# Patient Record
Sex: Female | Born: 1995 | Race: White | Hispanic: No | Marital: Single
Health system: Southern US, Community
[De-identification: ages and names within clinical notes are randomized; demographics above are authoritative.]

---

## 2017-09-06 ENCOUNTER — Emergency Department (HOSPITAL_COMMUNITY)
Admission: EM | Admit: 2017-09-06 | Discharge: 2017-09-07 | Disposition: A | Discharge status: Home / Self Care | Payer: BLUE CROSS/BLUE SHIELD | Attending: Physician Assistant | Admitting: Physician Assistant

## 2017-09-06 ENCOUNTER — Other Ambulatory Visit: Payer: Self-pay

## 2017-09-06 ENCOUNTER — Emergency Department (HOSPITAL_COMMUNITY): Payer: BLUE CROSS/BLUE SHIELD

## 2017-09-06 DIAGNOSIS — Y9364 Activity, baseball: Secondary | ICD-10-CM | POA: Diagnosis not present

## 2017-09-06 DIAGNOSIS — W228XXA Striking against or struck by other objects, initial encounter: Secondary | ICD-10-CM | POA: Insufficient documentation

## 2017-09-06 DIAGNOSIS — Y999 Unspecified external cause status: Secondary | ICD-10-CM | POA: Insufficient documentation

## 2017-09-06 DIAGNOSIS — Y929 Unspecified place or not applicable: Secondary | ICD-10-CM | POA: Insufficient documentation

## 2017-09-06 DIAGNOSIS — S139XXA Sprain of joints and ligaments of unspecified parts of neck, initial encounter: Secondary | ICD-10-CM

## 2017-09-06 DIAGNOSIS — S134XXA Sprain of ligaments of cervical spine, initial encounter: Secondary | ICD-10-CM | POA: Diagnosis present

## 2017-09-06 MED ORDER — OXYCODONE-ACETAMINOPHEN 5-325 MG PO TABS
1.0000 | ORAL_TABLET | Freq: Once | ORAL | Status: AC
  Administered 2017-09-07: 1 via ORAL
  Filled 2017-09-06: qty 1

## 2017-09-06 MED ORDER — LIDOCAINE 5 % EX PTCH
1.0000 | MEDICATED_PATCH | CUTANEOUS | Status: DC
  Administered 2017-09-07: 1 via TRANSDERMAL
  Filled 2017-09-06: qty 1

## 2017-09-06 MED ORDER — NAPROXEN 250 MG PO TABS
500.0000 mg | ORAL_TABLET | Freq: Once | ORAL | Status: AC
  Administered 2017-09-07: 500 mg via ORAL
  Filled 2017-09-06: qty 2

## 2017-09-06 MED ORDER — ONDANSETRON 4 MG PO TBDP
8.0000 mg | ORAL_TABLET | Freq: Once | ORAL | Status: AC
Start: 2017-09-07 — End: 2017-09-07
  Administered 2017-09-07: 8 mg via ORAL
  Filled 2017-09-06: qty 2

## 2017-09-06 NOTE — ED Triage Notes (Addendum)
Pt states that she dove for a softball yesterday and has had worsening neck pain and stiffness since. She reports tingling everywhere. Denies any episodes of incontinence. Pt ambulatory to triage.

## 2017-09-07 MED ORDER — LIDOCAINE 5 % EX PTCH: 1.0000 | MEDICATED_PATCH | CUTANEOUS | 0 refills | Status: AC

## 2017-09-07 MED ORDER — METHOCARBAMOL 500 MG PO TABS: 500.0000 mg | ORAL_TABLET | Freq: Three times a day (TID) | ORAL | 0 refills | Status: AC | PRN

## 2017-09-07 MED ORDER — NAPROXEN 500 MG PO TABS: 500.0000 mg | ORAL_TABLET | Freq: Two times a day (BID) | ORAL | 0 refills | Status: AC

## 2017-09-07 NOTE — Discharge Instructions (Signed)
You should restrict all sports acitivity and be excluded from play until you are asymptomatic with normal range of motion and strength. This can better be evaluated by ongoing follow ups with your primary care doctor or an Orthopedist.  We recommend daily use of naproxen for management of inflammation.  You may take Robaxin as prescribed for muscle spasms.  Supplement this with a Lidoderm patch as needed for persistent pain.  We advise that you alternate ice and heat to your neck 3-4 times per day for 15-20 minutes each time.  Should you experience inability to lift your arms or legs, complete loss of sensation in your extremities, incontinence of stool or urine, or any other concerning symptoms, promptly return to the emergency department for additional evaluation.
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
## 2018-08-06 IMAGING — CT CT CERVICAL SPINE W/O CM
3 of 4 series · 13 of 33 positions shown, 16 images · non-contrast
Comparison: None.

CLINICAL DATA: Worsening neck pain and stiffness after diving for
softball yesterday.

EXAM:
CT CERVICAL SPINE WITHOUT CONTRAST
TECHNIQUE: Multidetector CT imaging of the cervical spine was performed without
intravenous contrast. Multiplanar CT image reconstructions were also
generated.

[Series 4: c_spine 2.0 st · axial · 0.40mm/px · z∈[-282,-154]mm · 5 of 97 slices shown, 7 images]
[im 17/97  soft-tissue]
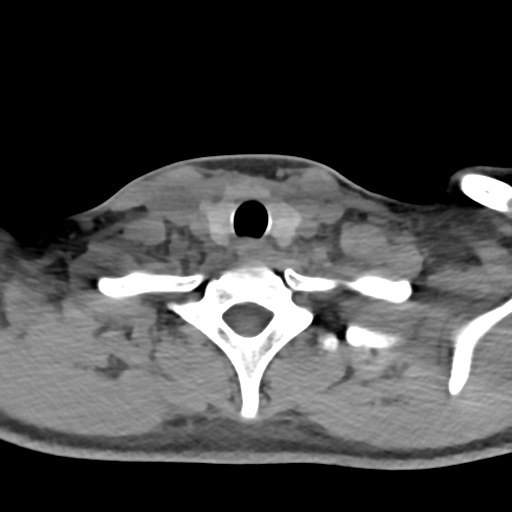
[im 17/97  bone]
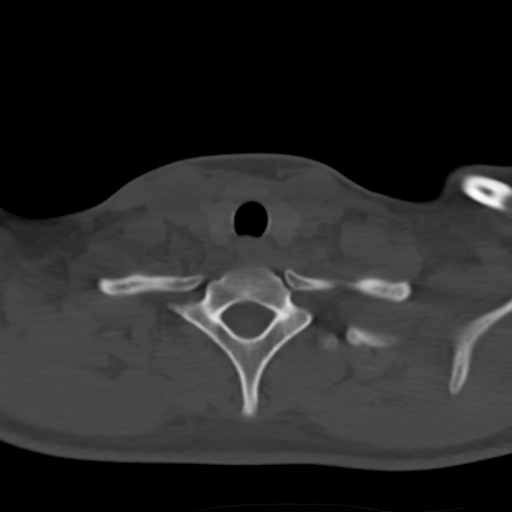
[im 33/97  bone]
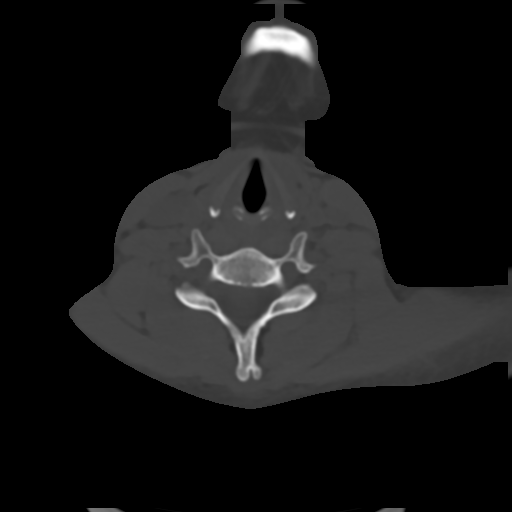
[im 49/97  bone]
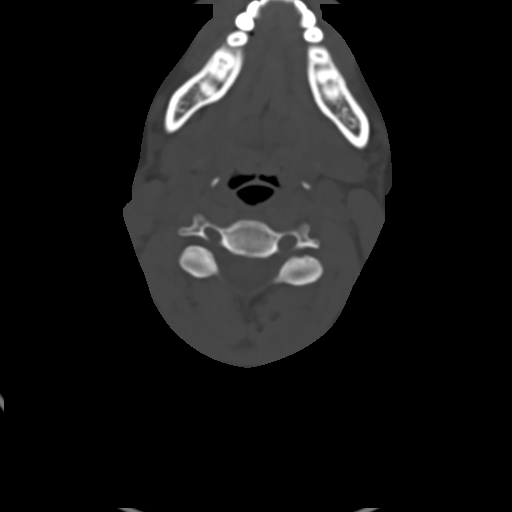
[im 65/97  bone]
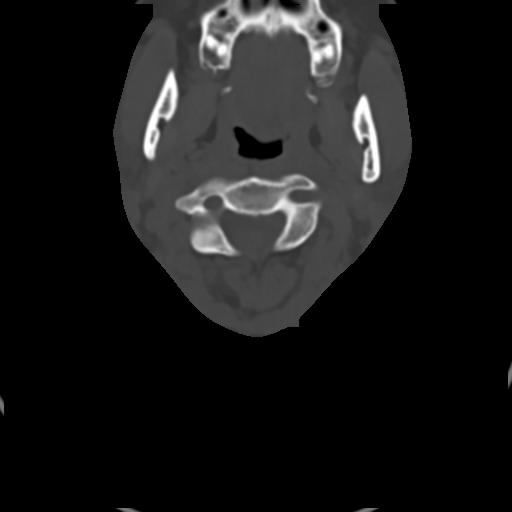
[im 81/97  soft-tissue]
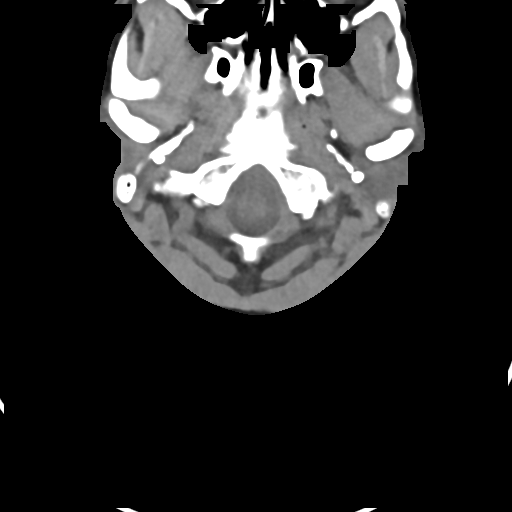
[im 81/97  bone]
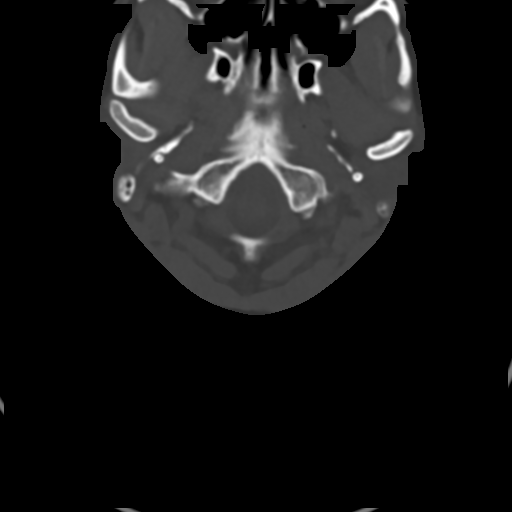

[Series 6: c_spine 2.0 sag bone · sagittal · 0.28mm/px · 5 of 61 slices shown, 6 images]
[im 21/61  bone]
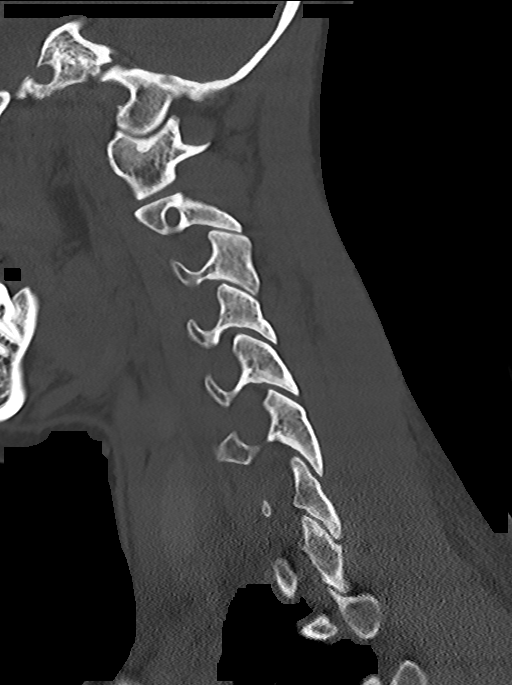
[im 26/61  bone]
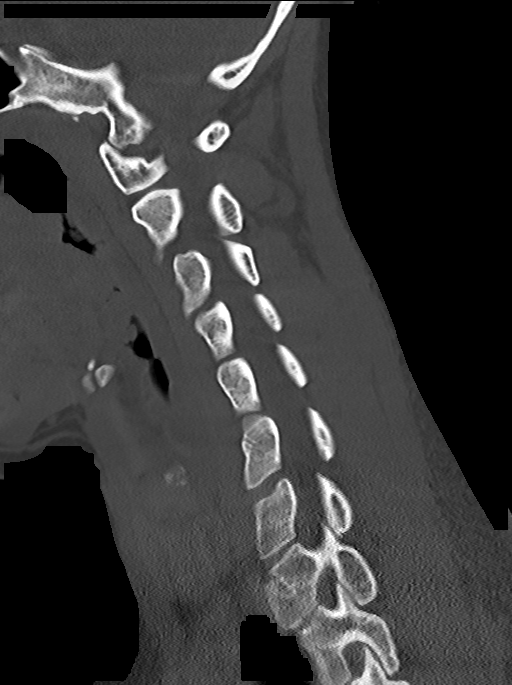
[im 31/61  soft-tissue]
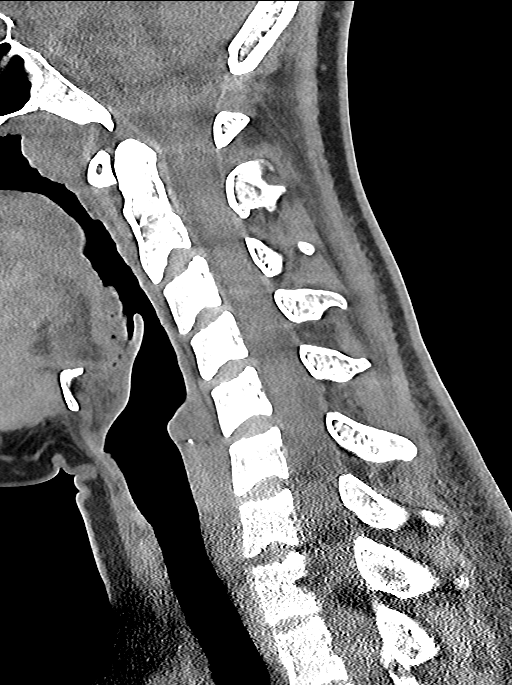
[im 31/61  bone]
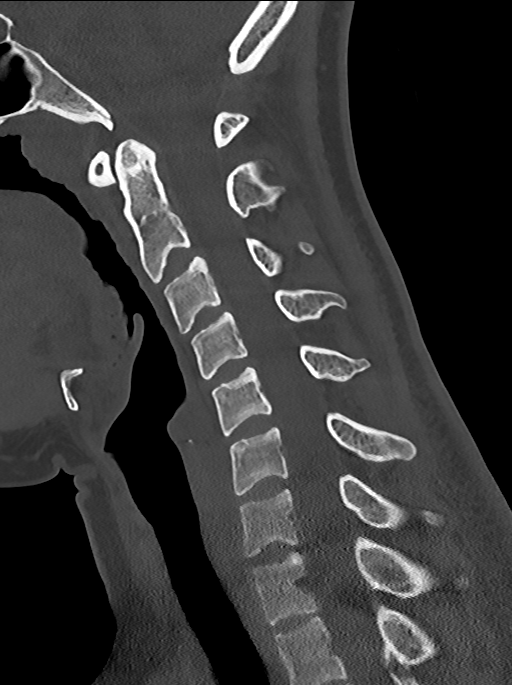
[im 36/61  bone]
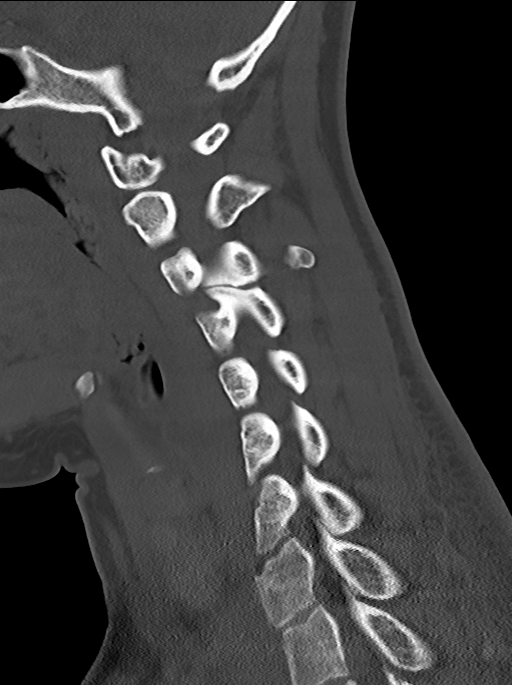
[im 41/61  bone]
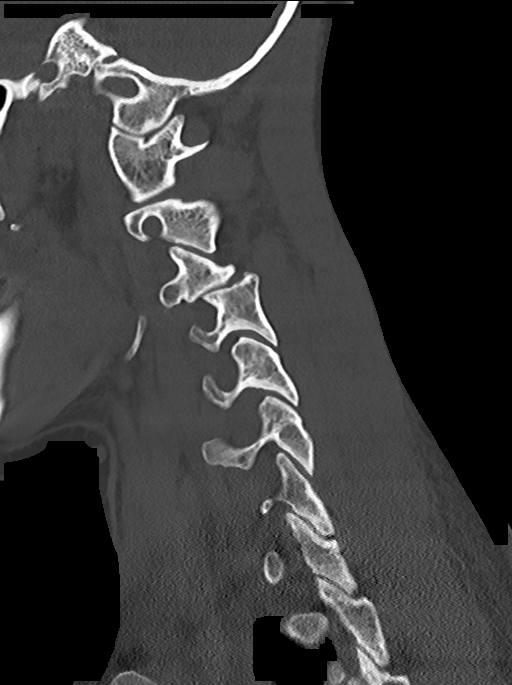

[Series 7: c_spine 2.0 cor bone · coronal · 0.28mm/px · 3 of 61 slices shown]
[im 13/61  bone]
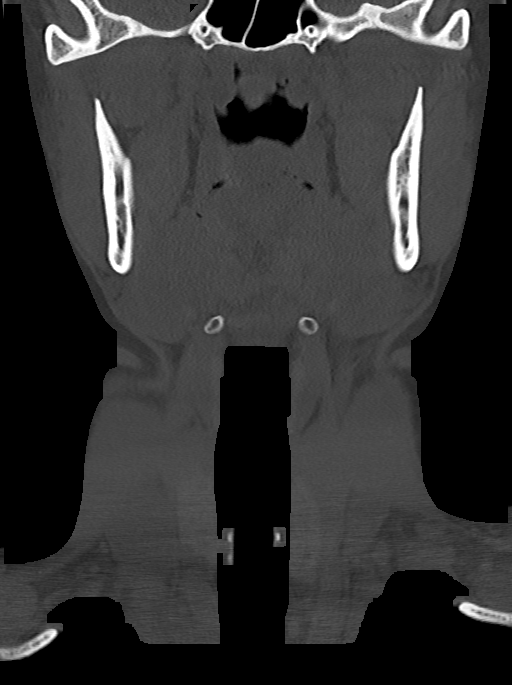
[im 25/61  bone]
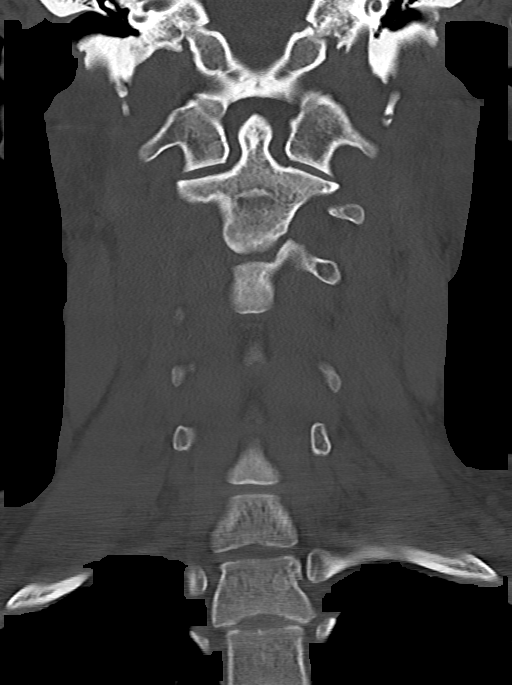
[im 37/61  bone]
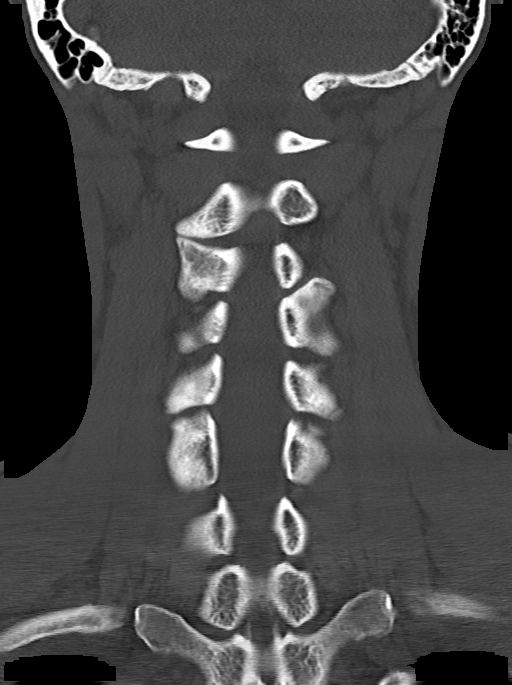

[13 of 33 positions shown; findings below may reference images not displayed]

FINDINGS: Alignment: Intact craniocervical relationship and atlantodental
interval. Mild reversal cervical lordosis may be due to patient
positioning or muscle spasm however there does appear to be an
irregular slightly thickened nuchal ligament adjacent to the C6
spinous process for which a nuchal ligament sprain is suggested.
Slight widening of the interspinous distance also noted between C5
and C[DATE] represent interspinous ligament sprain.

Skull base and vertebrae: No acute fracture. No primary bone lesion
or focal pathologic process.

Soft tissues and spinal canal: No prevertebral fluid or swelling. No
visible canal hematoma.

Disc levels: No canal stenosis, significant neural foraminal
encroachment or focal disc herniations.

Upper chest: Clear lung apices

Other: Mild heterogeneity of the normal-sized left thyroid gland.
Small few mm cystic nodules are suggested. Given patient
demographics and subcentimeter size of these suspected tiny cystic
nodules, no further evaluation recommendations are given per
consensus guidelines.
IMPRESSION: No acute cervical spine fracture or listhesis.

Reversal of cervical lordosis with slightly irregular thickened
appearance of the nuchal ligament adjacent to the C6 spinous process
suspicious for a mild ligamentous sprain. Additionally there slight
widening of the interspinous distance between C5 and C6 that could
potentially also represent a stigmata of an interspinous ligament
sprain.

## 2019-08-26 ENCOUNTER — Ambulatory Visit: Payer: PRIVATE HEALTH INSURANCE | Attending: Internal Medicine

## 2019-08-26 DIAGNOSIS — Z20822 Contact with and (suspected) exposure to covid-19: Secondary | ICD-10-CM

## 2019-08-27 LAB — NOVEL CORONAVIRUS, NAA: SARS-CoV-2, NAA: NOT DETECTED
